# Patient Record
Sex: Female | Born: 1970 | Race: White | Hispanic: No | Smoking: Never smoker
Health system: Southern US, Community
[De-identification: ages and names within clinical notes are randomized; demographics above are authoritative.]

## PROBLEM LIST (undated history)

## (undated) DIAGNOSIS — I10 Essential (primary) hypertension: Secondary | ICD-10-CM

## (undated) HISTORY — PX: DILATION AND CURETTAGE OF UTERUS: SHX78

---

## 1998-10-18 ENCOUNTER — Other Ambulatory Visit: Admission: RE | Admit: 1998-10-18 | Discharge: 1998-10-18 | Payer: Self-pay | Admitting: Obstetrics and Gynecology

## 1999-04-10 ENCOUNTER — Other Ambulatory Visit: Admission: RE | Admit: 1999-04-10 | Discharge: 1999-04-10 | Payer: Self-pay | Admitting: Gynecology

## 2000-04-22 ENCOUNTER — Other Ambulatory Visit: Admission: RE | Admit: 2000-04-22 | Discharge: 2000-04-22 | Payer: Self-pay | Admitting: Gynecology

## 2001-08-19 ENCOUNTER — Other Ambulatory Visit: Admission: RE | Admit: 2001-08-19 | Discharge: 2001-08-19 | Payer: Self-pay | Admitting: *Deleted

## 2001-10-14 ENCOUNTER — Other Ambulatory Visit: Admission: RE | Admit: 2001-10-14 | Discharge: 2001-10-14 | Payer: Self-pay | Admitting: *Deleted

## 2002-02-09 ENCOUNTER — Other Ambulatory Visit: Admission: RE | Admit: 2002-02-09 | Discharge: 2002-02-09 | Payer: Self-pay | Admitting: Obstetrics and Gynecology

## 2002-03-24 ENCOUNTER — Ambulatory Visit (HOSPITAL_COMMUNITY): Admission: RE | Admit: 2002-03-24 | Discharge: 2002-03-24 | Payer: Self-pay | Admitting: Obstetrics and Gynecology

## 2002-06-18 ENCOUNTER — Inpatient Hospital Stay (HOSPITAL_COMMUNITY): Admission: AD | Admit: 2002-06-18 | Discharge: 2002-06-18 | Payer: Self-pay | Admitting: Obstetrics and Gynecology

## 2002-06-19 ENCOUNTER — Inpatient Hospital Stay (HOSPITAL_COMMUNITY): Admission: AD | Admit: 2002-06-19 | Discharge: 2002-06-21 | Payer: Self-pay | Admitting: Obstetrics and Gynecology

## 2002-08-04 ENCOUNTER — Other Ambulatory Visit: Admission: RE | Admit: 2002-08-04 | Discharge: 2002-08-04 | Payer: Self-pay | Admitting: Obstetrics and Gynecology

## 2003-01-21 ENCOUNTER — Other Ambulatory Visit: Admission: RE | Admit: 2003-01-21 | Discharge: 2003-01-21 | Payer: Self-pay | Admitting: Obstetrics and Gynecology

## 2003-07-14 ENCOUNTER — Other Ambulatory Visit: Admission: RE | Admit: 2003-07-14 | Discharge: 2003-07-14 | Payer: Self-pay | Admitting: Obstetrics and Gynecology

## 2004-05-01 ENCOUNTER — Other Ambulatory Visit: Admission: RE | Admit: 2004-05-01 | Discharge: 2004-05-01 | Payer: Self-pay | Admitting: Obstetrics and Gynecology

## 2005-01-29 ENCOUNTER — Encounter: Admission: RE | Admit: 2005-01-29 | Discharge: 2005-01-29 | Payer: Self-pay | Admitting: Obstetrics and Gynecology

## 2005-07-04 ENCOUNTER — Other Ambulatory Visit: Admission: RE | Admit: 2005-07-04 | Discharge: 2005-07-04 | Payer: Self-pay | Admitting: Obstetrics and Gynecology

## 2005-12-04 ENCOUNTER — Encounter: Admission: RE | Admit: 2005-12-04 | Discharge: 2005-12-04 | Payer: Self-pay | Admitting: Obstetrics and Gynecology

## 2007-04-09 ENCOUNTER — Ambulatory Visit (HOSPITAL_COMMUNITY): Admission: RE | Admit: 2007-04-09 | Discharge: 2007-04-09 | Payer: Self-pay | Admitting: Obstetrics and Gynecology

## 2007-04-09 ENCOUNTER — Encounter (INDEPENDENT_AMBULATORY_CARE_PROVIDER_SITE_OTHER): Payer: Self-pay | Admitting: Obstetrics and Gynecology

## 2007-08-26 ENCOUNTER — Ambulatory Visit (HOSPITAL_COMMUNITY): Admission: RE | Admit: 2007-08-26 | Discharge: 2007-08-26 | Payer: Self-pay | Admitting: Family Medicine

## 2010-07-25 NOTE — Op Note (Signed)
[name], Traci               ACCOUNT NO.:  [id number]   MEDICAL RECORD NO.:  [id number]          PATIENT TYPE:  AMB   LOCATION:  SDC                           FACILITY:  [facility]   PHYSICIAN:  Guy Sandifer. Henderson Cloud, M.D. DATE OF BIRTH:  1970/03/27   DATE OF PROCEDURE:  04/09/2007  DATE OF DISCHARGE:                               OPERATIVE REPORT   PREOPERATIVE DIAGNOSIS:  Menometrorrhagia.   POSTOPERATIVE DIAGNOSIS:  Menometrorrhagia.   PROCEDURE:  Hysteroscopy with dilatation curettage.  1% Xylocaine  paracervical block.   SURGEON:  Guy Sandifer. Henderson Cloud, M.D.   ANESTHESIA:  MAC.   SPECIMEN:  Endometrial curettings to pathology.   ESTIMATED BLOOD LOSS:  Minimal.   INS AND OUTS OF DISTENDING MEDIA:  65 mL deficit.   INDICATIONS AND CONSENT:  This patient is a 40 year old G2, P1, who has  had increasingly heavy and irregular menses.  Ultrasound in the office  was suggestive of small polypoid type structures measuring approximately  5-6 mm.  After discussion of options, hysteroscopy, dilatation curettage  with resectoscope was been discussed.  Potential risks and complications  were discussed preoperatively including but not limited to infection,  uterine perforation, organ damage, bleeding requiring transfusion of  blood products with possible transfusion reaction, HIV and hepatitis  acquisition, DVT, PE, pneumonia, laparotomy, hysterectomy, recurrent  abnormal bleeding and/or infertility issues.  All questions were  answered and consent is signed on the chart.   FINDINGS:  On the anterior uterine wall are two or three folds in the  endometrium.  No distinct abnormal masses were noted.  Fallopian tube  ostia noted bilaterally.   PROCEDURE:  The patient is taken to operating room where she was  identified, placed dorsosupine position.  General anesthesia is induced.  She is then placed in dorsal lithotomy position where she is prepped,  bladder straight catheterized and draped in  sterile fashion.  Bivalve  speculum was placed in vagina.  The anterior cervical lip was injected  with 1% Xylocaine and grasped with single-tooth tenaculum.  Paracervical  block was placed in the 2, 4, 5, 7, 8, and 10 o'clock positions with  approximately 20 mL total of 1% plain Xylocaine.  Cervix was gently  progressively dilated.  Diagnostic hysteroscope was placed in the  endocervical canal and advanced under direct visualization using  distending media.  The above findings noted.  Hysteroscope is withdrawn.  Sharp curettage is  carried out.  The hysteroscope was reintroduced again advanced under  direct visualization with distending media.  Cavity is clean.  There are  no abnormal structures noted.  Good hemostasis is noted.  All  instruments are removed.  All counts correct.  The patient is awakened,  taken to recovery room in stable condition.      Guy Sandifer Henderson Cloud, M.D.  Electronically Signed     JET/MEDQ  D:  04/09/2007  T:  04/09/2007  Job:  [id number]

## 2010-11-30 LAB — BASIC METABOLIC PANEL
BUN: 2 — ABNORMAL LOW
CO2: 30
Calcium: 9.2
Chloride: 103
Creatinine, Ser: 0.67
GFR calc Af Amer: >60
GFR calc non Af Amer: >60
Glucose, Bld: 95
Potassium: 3.6
Sodium: 139

## 2010-11-30 LAB — HCG, SERUM, QUALITATIVE: Preg, Serum: NEGATIVE

## 2010-11-30 LAB — CBC
HCT: 41
Hemoglobin: 14.4
MCHC: 35.1
MCV: 94.2
Platelets: 261
RBC: 4.35
RDW: 12
WBC: 4.9

## 2013-11-03 ENCOUNTER — Other Ambulatory Visit: Payer: Self-pay | Admitting: Obstetrics and Gynecology

## 2013-11-30 ENCOUNTER — Other Ambulatory Visit: Payer: Self-pay | Admitting: Obstetrics and Gynecology

## 2013-12-01 LAB — CYTOLOGY - PAP

## 2013-12-15 ENCOUNTER — Other Ambulatory Visit: Payer: Self-pay | Admitting: Obstetrics and Gynecology

## 2013-12-16 ENCOUNTER — Other Ambulatory Visit (HOSPITAL_COMMUNITY): Payer: Self-pay | Admitting: Obstetrics and Gynecology

## 2013-12-16 DIAGNOSIS — E049 Nontoxic goiter, unspecified: Secondary | ICD-10-CM

## 2014-01-12 ENCOUNTER — Ambulatory Visit (HOSPITAL_COMMUNITY)
Admission: RE | Admit: 2014-01-12 | Discharge: 2014-01-12 | Disposition: A | Discharge status: Home / Self Care | Payer: No Typology Code available for payment source | Source: Ambulatory Visit | Attending: Interventional Radiology | Admitting: Interventional Radiology

## 2014-01-12 DIAGNOSIS — E049 Nontoxic goiter, unspecified: Secondary | ICD-10-CM | POA: Insufficient documentation

## 2014-01-13 ENCOUNTER — Ambulatory Visit (HOSPITAL_COMMUNITY): Payer: No Typology Code available for payment source

## 2014-12-27 ENCOUNTER — Other Ambulatory Visit: Payer: Self-pay | Admitting: Obstetrics and Gynecology

## 2014-12-27 DIAGNOSIS — R928 Other abnormal and inconclusive findings on diagnostic imaging of breast: Secondary | ICD-10-CM

## 2015-01-07 ENCOUNTER — Ambulatory Visit
Admission: RE | Admit: 2015-01-07 | Discharge: 2015-01-07 | Disposition: A | Discharge status: Home / Self Care | Payer: PRIVATE HEALTH INSURANCE | Source: Ambulatory Visit | Attending: Obstetrics and Gynecology | Admitting: Obstetrics and Gynecology

## 2015-01-07 DIAGNOSIS — R928 Other abnormal and inconclusive findings on diagnostic imaging of breast: Secondary | ICD-10-CM

## 2015-07-23 ENCOUNTER — Encounter: Payer: Self-pay | Admitting: Emergency Medicine

## 2015-07-23 ENCOUNTER — Emergency Department: Payer: No Typology Code available for payment source

## 2015-07-23 ENCOUNTER — Emergency Department
Admission: EM | Admit: 2015-07-23 | Discharge: 2015-07-23 | Disposition: A | Discharge status: Home / Self Care | Payer: No Typology Code available for payment source | Attending: Emergency Medicine | Admitting: Emergency Medicine

## 2015-07-23 DIAGNOSIS — X58XXXA Exposure to other specified factors, initial encounter: Secondary | ICD-10-CM | POA: Insufficient documentation

## 2015-07-23 DIAGNOSIS — S29011A Strain of muscle and tendon of front wall of thorax, initial encounter: Secondary | ICD-10-CM

## 2015-07-23 DIAGNOSIS — I10 Essential (primary) hypertension: Secondary | ICD-10-CM | POA: Insufficient documentation

## 2015-07-23 DIAGNOSIS — Y939 Activity, unspecified: Secondary | ICD-10-CM | POA: Diagnosis not present

## 2015-07-23 DIAGNOSIS — Y929 Unspecified place or not applicable: Secondary | ICD-10-CM | POA: Insufficient documentation

## 2015-07-23 DIAGNOSIS — Y990 Civilian activity done for income or pay: Secondary | ICD-10-CM | POA: Diagnosis not present

## 2015-07-23 DIAGNOSIS — S299XXA Unspecified injury of thorax, initial encounter: Secondary | ICD-10-CM | POA: Diagnosis present

## 2015-07-23 HISTORY — DX: Essential (primary) hypertension: I10

## 2015-07-23 LAB — COMPREHENSIVE METABOLIC PANEL
ALT: 10 U/L — ABNORMAL LOW (ref 14–54)
ANION GAP: 5 (ref 5–15)
AST: 15 U/L (ref 15–41)
Albumin: 3.6 g/dL (ref 3.5–5.0)
Alkaline Phosphatase: 74 U/L (ref 38–126)
BUN: 7 mg/dL (ref 6–20)
CO2: 27 mmol/L (ref 22–32)
Calcium: 8.8 mg/dL — ABNORMAL LOW (ref 8.9–10.3)
Chloride: 106 mmol/L (ref 101–111)
Creatinine, Ser: 0.71 mg/dL (ref 0.44–1.00)
GFR calc Af Amer: >60 mL/min (ref >60)
Glucose, Bld: 100 mg/dL — ABNORMAL HIGH (ref 65–99)
Potassium: 3.5 mmol/L (ref 3.5–5.1)
Sodium: 138 mmol/L (ref 135–145)
TOTAL PROTEIN: 6.5 g/dL (ref 6.5–8.1)
Total Bilirubin: 0.4 mg/dL (ref 0.3–1.2)

## 2015-07-23 LAB — URINALYSIS COMPLETE WITH MICROSCOPIC (ARMC ONLY)
Bacteria, UA: NONE SEEN
Bilirubin Urine: NEGATIVE
Glucose, UA: NEGATIVE mg/dL
Ketones, ur: NEGATIVE mg/dL
NITRITE: NEGATIVE
Protein, ur: NEGATIVE mg/dL
Specific Gravity, Urine: 1.004 — ABNORMAL LOW (ref 1.005–1.030)
pH: 6 (ref 5.0–8.0)

## 2015-07-23 LAB — CBC
HCT: 39.1 % (ref 35.0–47.0)
Hemoglobin: 13.6 g/dL (ref 12.0–16.0)
MCH: 31.6 pg (ref 26.0–34.0)
MCHC: 34.8 g/dL (ref 32.0–36.0)
MCV: 90.9 fL (ref 80.0–100.0)
PLATELETS: 256 10*3/uL (ref 150–440)
RBC: 4.31 MIL/uL (ref 3.80–5.20)
RDW: 12.6 % (ref 11.5–14.5)
WBC: 6.6 10*3/uL (ref 3.6–11.0)

## 2015-07-23 LAB — TROPONIN I: Troponin I: <0.03 ng/mL (ref <0.031)

## 2015-07-23 MED ORDER — METHOCARBAMOL 750 MG PO TABS: 750.0000 mg | ORAL_TABLET | Freq: Four times a day (QID) | ORAL | Status: AC

## 2015-07-23 MED ORDER — IBUPROFEN 800 MG PO TABS: 800.0000 mg | ORAL_TABLET | Freq: Three times a day (TID) | ORAL | Status: AC | PRN

## 2015-07-23 NOTE — Discharge Instructions (Signed)
Muscle Strain °A muscle strain (pulled muscle) happens when a muscle is stretched beyond normal length. It happens when a sudden, violent force stretches your muscle too far. Usually, a few of the fibers in your muscle are torn. Muscle strain is common in athletes. Recovery usually takes 1-2 weeks. Complete healing takes 5-6 weeks.  °HOME CARE  °· Follow the PRICE method of treatment to help your injury get better. Do this the first 2-3 days after the injury: °¨ Protect. Protect the muscle to keep it from getting injured again. °¨ Rest. Limit your activity and rest the injured body part. °¨ Ice. Put ice in a plastic bag. Place a towel between your skin and the bag. Then, apply the ice and leave it on from 15-20 minutes each hour. After the third day, switch to moist heat packs. °¨ Compression. Use a splint or elastic bandage on the injured area for comfort. Do not put it on too tightly. °¨ Elevate. Keep the injured body part above the level of your heart. °· Only take medicine as told by your doctor. °· Warm up before doing exercise to prevent future muscle strains. °GET HELP IF:  °· You have more pain or puffiness (swelling) in the injured area. °· You feel numbness, tingling, or notice a loss of strength in the injured area. °MAKE SURE YOU:  °· Understand these instructions. °· Will watch your condition. °· Will get help right away if you are not doing well or get worse. °  °This information is not intended to replace advice given to you by your health care provider. Make sure you discuss any questions you have with your health care provider. °  °Document Released: 12/06/2007 Document Revised: 12/17/2012 Document Reviewed: 09/25/2012 °Elsevier Interactive Patient Education ©2016 [employer] ° °

## 2015-07-23 NOTE — ED Notes (Signed)
States feeling of pins and needles R arm today. Denies injury. Denies a lot of lifting or repeating work.

## 2015-07-23 NOTE — ED Notes (Signed)
Discussed discharge instructions, prescriptions, and follow-up care with patient. No questions or concerns at this time. Pt stable at discharge.  

## 2015-07-23 NOTE — ED Provider Notes (Signed)
[facility] Emergency Department Provider Note   ____________________________________________  Time seen: Approximately 3:03 PM  I have reviewed the triage vital signs and the nursing notes.   HISTORY  Chief Complaint Arm Pain    HPI Traci [name] is a 45 y.o. female patient complaining of 2 days of left chest pain. Patient stated pain is sharp and radiates to her upper left extremity. Patient also state yesterday afternoon. Patient states she had 2 episodes today 1 before work and one at work. Patient hasn't notified her supervisor of the chest pain he advised to come to ER for evaluation. Patient denies shortness of breath, vertigo,, or vision disturbance. Patient states she has a history of intermittent elevated blood pressure but was evaluated and not prescribed any medications. Patient also states she has history of anxiety and takes Xanax as needed. Patient stated this sensation is not like any her anxiety episodes. No palliative measures with this complaint. Patient's family history is remarkable for mother father having elevated blood pressure and heart disease. Patient rates the pain as a 5/10 and states that it seems to be easing up.   Past Medical History  Diagnosis Date  . Hypertension     There are no active problems to display for this patient.   Past Surgical History  Procedure Laterality Date  . Dilation and curettage of uterus      Current Outpatient Rx  Name  Route  Sig  Dispense  Refill  . ibuprofen (ADVIL,MOTRIN) 800 MG tablet   Oral   Take 1 tablet (800 mg total) by mouth every 8 (eight) hours as needed.   30 tablet   0   . methocarbamol (ROBAXIN-750) 750 MG tablet   Oral   Take 1 tablet (750 mg total) by mouth 4 (four) times daily.   20 tablet   0     Allergies Erythromycin  No family history on file.  Social History Social History  Substance Use Topics  . Smoking status: Never Smoker   . Smokeless  tobacco: None  . Alcohol Use: No    Review of Systems Constitutional: No fever/chills Eyes: No visual changes. ENT: No sore throat. Cardiovascular: Denies chest pain. Respiratory: Denies shortness of breath. Gastrointestinal: No abdominal pain.  No nausea, no vomiting.  No diarrhea.  No constipation. Genitourinary: Negative for dysuria. Musculoskeletal: Negative for back pain. Skin: Negative for rash. Neurological: Negative for headaches, focal weakness or numbness.    ____________________________________________   PHYSICAL EXAM:  VITAL SIGNS: ED Triage Vitals  Enc Vitals Group     BP 07/23/15 1441 134/93 mmHg     Pulse Rate 07/23/15 1441 85     Resp 07/23/15 1441 20     Temp 07/23/15 1441 98.2 F (36.8 C)     Temp Source 07/23/15 1441 Oral     SpO2 07/23/15 1441 100 %     Weight 07/23/15 1441 162 lb (73.483 kg)     Height 07/23/15 1441  (1.6 m)     Head Cir --      Peak Flow --      Pain Score --      Pain Loc --      Pain Edu? --      Excl. in GC? --     Constitutional: Alert and oriented. Well appearing and in no acute distress. Eyes: Conjunctivae are normal. PERRL. EOMI. Head: Atraumatic. Nose: No congestion/rhinnorhea. Mouth/Throat: Mucous membranes are moist.  Oropharynx non-erythematous. Neck:  No stridor.  No cervical spine tenderness to palpation. Hematological/Lymphatic/Immunilogical: No cervical lymphadenopathy. Cardiovascular: Normal rate, regular rhythm. Grossly normal heart sounds.  Good peripheral circulation. Respiratory: Normal respiratory effort.  No retractions. Lungs CTAB. Gastrointestinal: Soft and nontender. No distention. No abdominal bruits. No CVA tenderness. Musculoskeletal: No lower extremity tenderness nor edema.  No joint effusions. Neurologic:  Normal speech and language. No gross focal neurologic deficits are appreciated. No gait instability. Skin:  Skin is warm, dry and intact. No rash noted. Psychiatric: Mood and affect are  normal. Speech and behavior are normal.  ____________________________________________   LABS (all labs ordered are listed, but only abnormal results are displayed)  Labs Reviewed  COMPREHENSIVE METABOLIC PANEL - Abnormal; Notable for the following:    Glucose, Bld 100 (*)    Calcium 8.8 (*)    ALT 10 (*)    All other components within normal limits  URINALYSIS COMPLETEWITH MICROSCOPIC ([facility] ONLY) - Abnormal; Notable for the following:    Color, Urine STRAW (*)    APPearance CLEAR (*)    Specific Gravity, Urine 1.004 (*)    Hgb urine dipstick 2+ (*)    Leukocytes, UA TRACE (*)    Squamous Epithelial / LPF 0-5 (*)    All other components within normal limits  CBC  TROPONIN I   ____________________________________________  EKG  Normal sinus rhythm ____________________________________________  RADIOLOGY  No acute findings on chest x-ray ____________________________________________   PROCEDURES  Procedure(s) performed: None  Critical Care performed: No  ____________________________________________   INITIAL IMPRESSION / ASSESSMENT AND PLAN / ED COURSE  Pertinent labs & imaging results that were available during my care of the patient were reviewed by me and considered in my medical decision making (see chart for details). Negative for cardiac findings  Muscle strain chest wall. Discuss x-ray and lab findings with patient. Patient given discharge care instructions. He is given a prescription for Robaxin and ibuprofen. Patient [facility]. ____________________________________________   FINAL CLINICAL IMPRESSION(S) / ED DIAGNOSES  Final diagnoses:  Muscle strain of chest wall, initial encounter      NEW MEDICATIONS STARTED DURING THIS VISIT:  New Prescriptions   IBUPROFEN (ADVIL,MOTRIN) 800 MG TABLET    Take 1 tablet (800 mg total) by mouth every 8 (eight) hours as needed.   METHOCARBAMOL (ROBAXIN-750)  750 MG TABLET    Take 1 tablet (750 mg total) by mouth 4 (four) times daily.     Note:  This document was prepared using Dragon voice recognition software and may include unintentional dictation errors.    Joni ReiningRonald K Smith, PA-C 07/23/15 1717  Jennye MoccasinBrian S Quigley, MD 07/23/15 [phone]

## 2015-07-23 NOTE — ED Notes (Addendum)
Pt states it is the LEFT arm that is tingling and that she has occassional sudden sharp pain that hits in her chest.

## 2015-07-23 NOTE — ED Provider Notes (Signed)
ED ECG REPORT I, Arelia LongestSchaevitz,  Janeice Stegall M, the attending physician, personally viewed and interpreted this ECG.   Date: 07/23/2015  EKG Time: 1500  Rate: 84  Rhythm: normal sinus rhythm  Axis: Normal axis  Intervals:none  ST&T Change: No ST segment elevation or depression. No abnormal T-wave inversion.   Myrna Blazeravid Matthew Lyncoln Maskell, MD 07/23/15 [phone]

## 2016-01-16 ENCOUNTER — Other Ambulatory Visit: Payer: Self-pay | Admitting: Obstetrics and Gynecology

## 2016-01-16 DIAGNOSIS — Z1231 Encounter for screening mammogram for malignant neoplasm of breast: Secondary | ICD-10-CM

## 2016-01-26 ENCOUNTER — Ambulatory Visit
Admission: RE | Admit: 2016-01-26 | Discharge: 2016-01-26 | Disposition: A | Discharge status: Home / Self Care | Payer: No Typology Code available for payment source | Source: Ambulatory Visit | Attending: Obstetrics and Gynecology | Admitting: Obstetrics and Gynecology

## 2016-01-26 ENCOUNTER — Encounter: Payer: Self-pay | Admitting: Radiology

## 2016-01-26 DIAGNOSIS — Z1231 Encounter for screening mammogram for malignant neoplasm of breast: Secondary | ICD-10-CM | POA: Insufficient documentation

## 2016-02-15 ENCOUNTER — Other Ambulatory Visit: Payer: Self-pay | Admitting: *Deleted

## 2016-02-15 ENCOUNTER — Inpatient Hospital Stay
Admission: RE | Admit: 2016-02-15 | Discharge: 2016-02-15 | Disposition: A | Discharge status: Home / Self Care | Payer: Self-pay | Source: Ambulatory Visit | Attending: *Deleted | Admitting: *Deleted

## 2016-02-15 DIAGNOSIS — Z9289 Personal history of other medical treatment: Secondary | ICD-10-CM

## 2017-01-29 ENCOUNTER — Other Ambulatory Visit: Payer: Self-pay | Admitting: Obstetrics and Gynecology

## 2017-01-29 DIAGNOSIS — Z1231 Encounter for screening mammogram for malignant neoplasm of breast: Secondary | ICD-10-CM

## 2017-02-20 ENCOUNTER — Ambulatory Visit
Admission: RE | Admit: 2017-02-20 | Discharge: 2017-02-20 | Disposition: A | Discharge status: Home / Self Care | Payer: 59 | Source: Ambulatory Visit | Attending: Obstetrics and Gynecology | Admitting: Obstetrics and Gynecology

## 2017-02-20 DIAGNOSIS — Z1231 Encounter for screening mammogram for malignant neoplasm of breast: Secondary | ICD-10-CM | POA: Insufficient documentation

## 2018-09-15 ENCOUNTER — Other Ambulatory Visit: Payer: Self-pay | Admitting: Obstetrics and Gynecology

## 2018-09-15 DIAGNOSIS — Z1231 Encounter for screening mammogram for malignant neoplasm of breast: Secondary | ICD-10-CM

## 2018-10-24 ENCOUNTER — Other Ambulatory Visit: Payer: Self-pay | Admitting: Obstetrics and Gynecology

## 2018-10-24 DIAGNOSIS — Z1231 Encounter for screening mammogram for malignant neoplasm of breast: Secondary | ICD-10-CM

## 2019-11-27 ENCOUNTER — Ambulatory Visit: Payer: PRIVATE HEALTH INSURANCE | Attending: Obstetrics and Gynecology

## 2019-12-02 ENCOUNTER — Ambulatory Visit
Admission: RE | Admit: 2019-12-02 | Discharge: 2019-12-02 | Disposition: A | Discharge status: Home / Self Care | Payer: PRIVATE HEALTH INSURANCE | Source: Ambulatory Visit | Attending: Obstetrics and Gynecology | Admitting: Obstetrics and Gynecology

## 2019-12-02 ENCOUNTER — Other Ambulatory Visit: Payer: Self-pay

## 2019-12-02 ENCOUNTER — Other Ambulatory Visit: Payer: Self-pay | Admitting: Obstetrics and Gynecology

## 2019-12-02 DIAGNOSIS — Z1231 Encounter for screening mammogram for malignant neoplasm of breast: Secondary | ICD-10-CM

## 2021-10-23 ENCOUNTER — Other Ambulatory Visit: Payer: Self-pay | Admitting: Family Medicine

## 2021-10-23 ENCOUNTER — Other Ambulatory Visit: Payer: Self-pay | Admitting: Obstetrics and Gynecology

## 2021-10-23 DIAGNOSIS — Z1231 Encounter for screening mammogram for malignant neoplasm of breast: Secondary | ICD-10-CM

## 2021-12-14 ENCOUNTER — Other Ambulatory Visit: Payer: Self-pay | Admitting: Family Medicine

## 2021-12-14 DIAGNOSIS — R1902 Left upper quadrant abdominal swelling, mass and lump: Secondary | ICD-10-CM

## 2021-12-18 ENCOUNTER — Ambulatory Visit
Admission: RE | Admit: 2021-12-18 | Discharge: 2021-12-18 | Disposition: A | Discharge status: Home / Self Care | Payer: BC Managed Care – PPO | Source: Ambulatory Visit | Attending: Family Medicine | Admitting: Family Medicine

## 2021-12-18 DIAGNOSIS — Z1231 Encounter for screening mammogram for malignant neoplasm of breast: Secondary | ICD-10-CM | POA: Insufficient documentation

## 2022-01-01 ENCOUNTER — Ambulatory Visit
Admission: RE | Admit: 2022-01-01 | Discharge: 2022-01-01 | Disposition: A | Discharge status: Home / Self Care | Payer: BC Managed Care – PPO | Source: Ambulatory Visit | Attending: Family Medicine | Admitting: Family Medicine

## 2022-01-01 DIAGNOSIS — R1902 Left upper quadrant abdominal swelling, mass and lump: Secondary | ICD-10-CM | POA: Insufficient documentation

## 2022-01-07 IMAGING — MG DIGITAL SCREENING BILAT W/ TOMO W/ CAD
6 of 10 series · 6 of 30 positions shown · non-contrast
Comparison: Previous exam(s).

CLINICAL DATA: Screening.

EXAM:
DIGITAL SCREENING BILATERAL MAMMOGRAM WITH TOMO AND CAD

[L MLO synth-2D (1 of 2)]
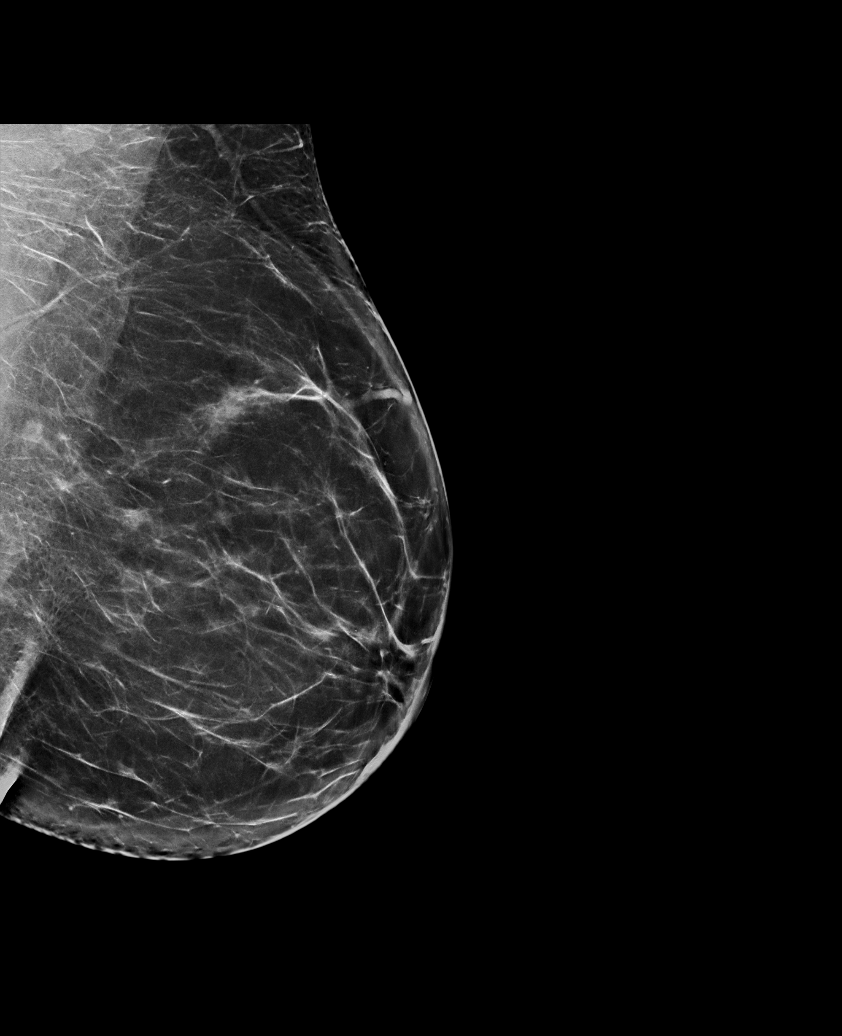

[R MLO synth-2D]
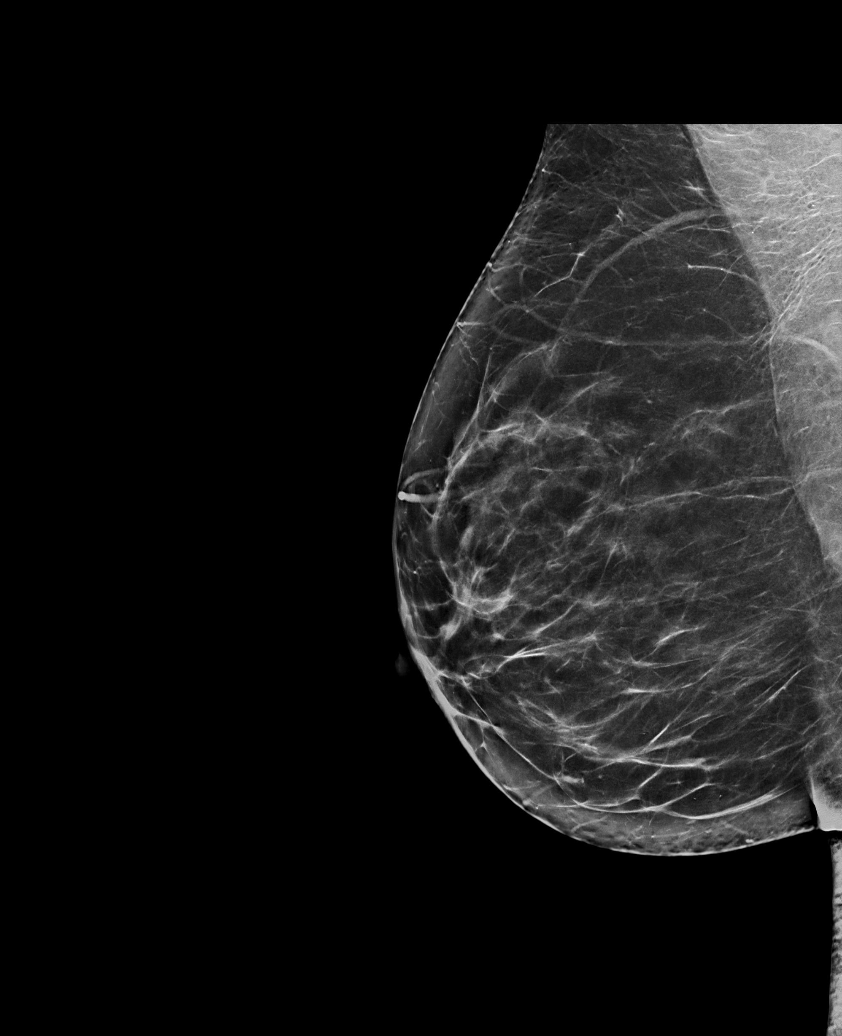

[L MLO synth-2D (2 of 2)]
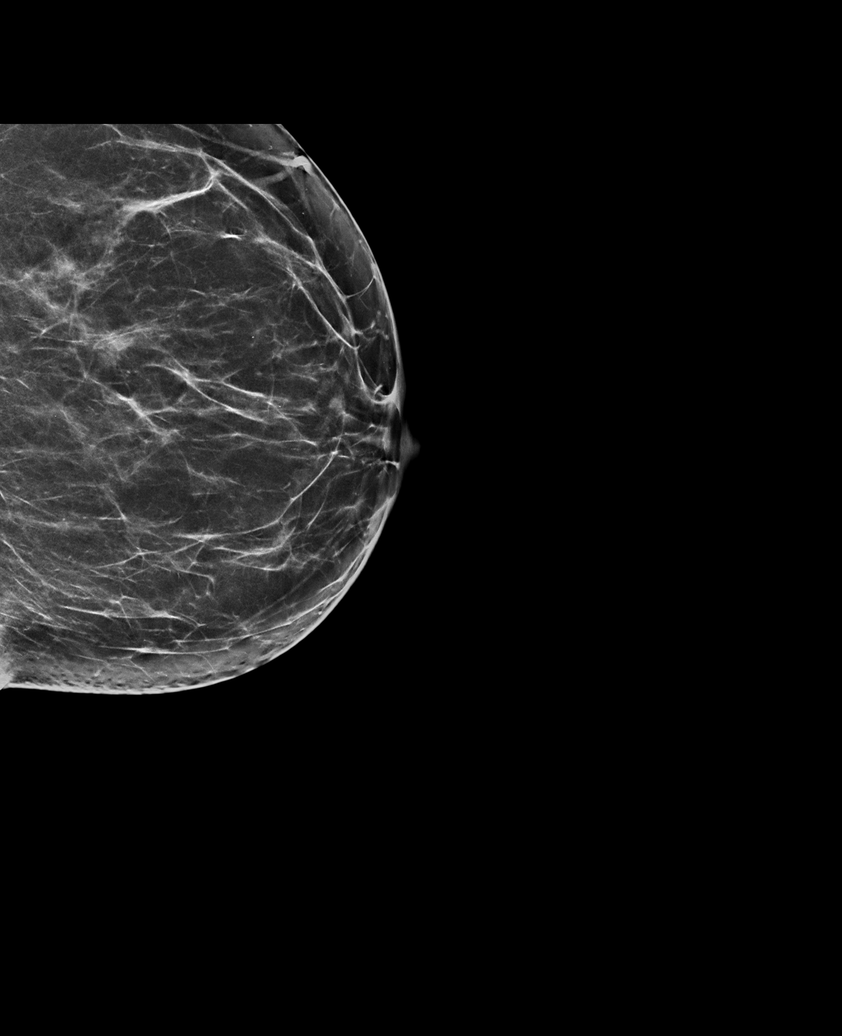

[R CC synth-2D]
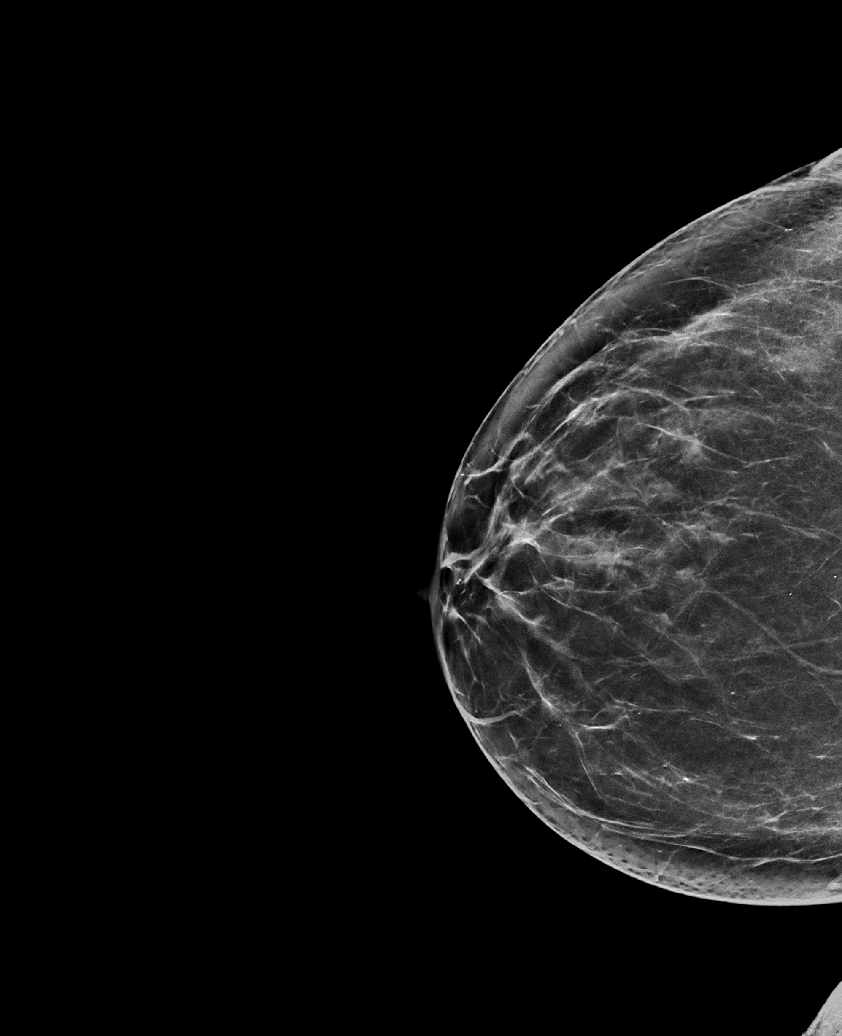

[L CC synth-2D]
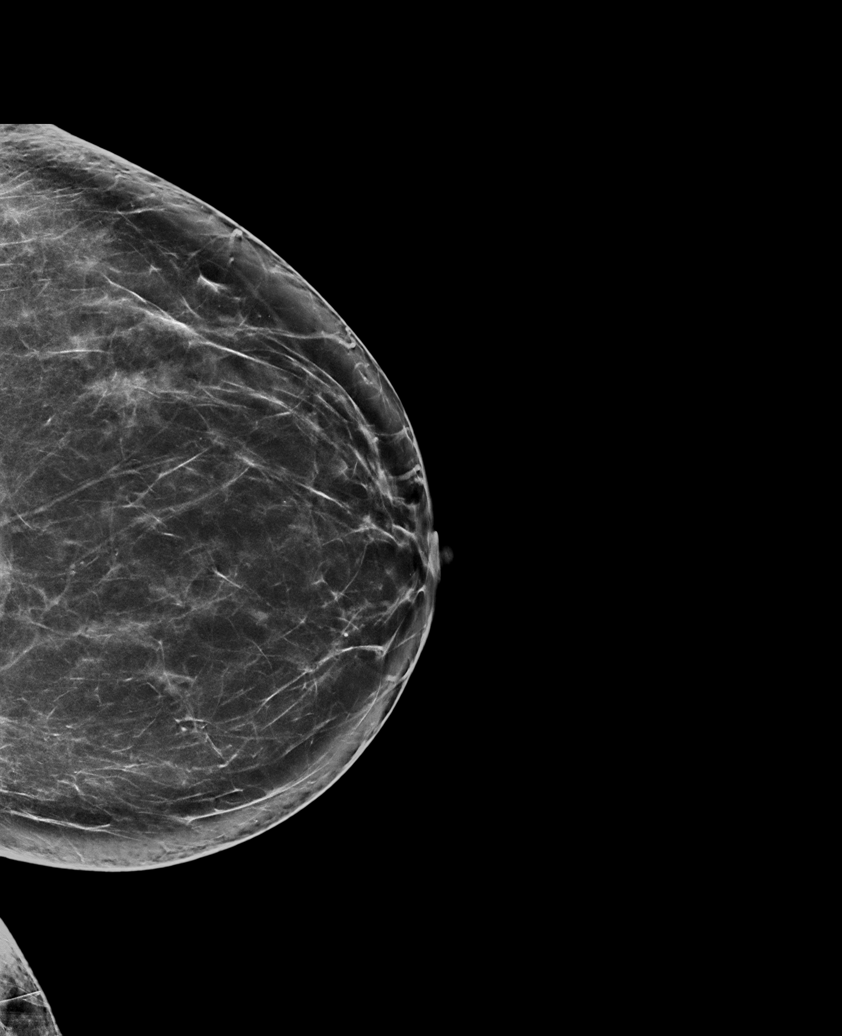

[R MLO tomo · tomo slice 41/81.0]
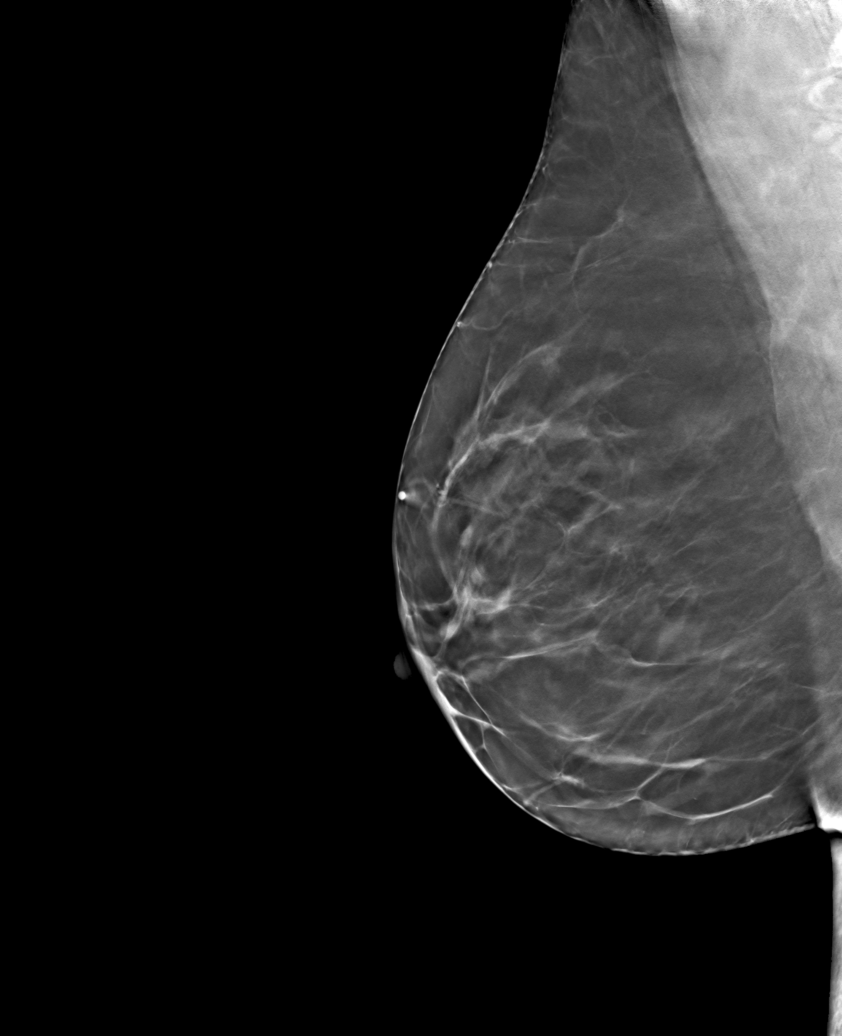

[6 of 30 positions shown; findings below may reference images not displayed]

ACR Breast Density Category b: There are scattered areas of
fibroglandular density.
FINDINGS: There are no findings suspicious for malignancy. Images were
processed with CAD.
IMPRESSION: No mammographic evidence of malignancy. A result letter of this
screening mammogram will be mailed directly to the patient.

RECOMMENDATION:
Screening mammogram in one year. (Code:[id number])

BI-RADS CATEGORY  1: Negative.
